# Patient Record
Sex: Male | Born: 1990 | Hispanic: No | Marital: Single
Health system: Southern US, Community
[De-identification: ages and names within clinical notes are randomized; demographics above are authoritative.]

---
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
## 2012-12-28 ENCOUNTER — Observation Stay: Payer: Self-pay | Admitting: Internal Medicine

## 2012-12-28 LAB — URINALYSIS, COMPLETE
Bacteria: NONE SEEN
Bilirubin,UR: NEGATIVE
Glucose,UR: NEGATIVE mg/dL (ref 0–75)
Leukocyte Esterase: NEGATIVE
Ph: 6 (ref 4.5–8.0)
Specific Gravity: 1.029 (ref 1.003–1.030)
Squamous Epithelial: NONE SEEN
WBC UR: 1 /HPF (ref 0–5)

## 2012-12-28 LAB — SALICYLATE LEVEL: Salicylates, Serum: <1.7 mg/dL

## 2012-12-28 LAB — DRUG SCREEN, URINE
Barbiturates, Ur Screen: NEGATIVE (ref ?–200)
Benzodiazepine, Ur Scrn: NEGATIVE (ref ?–200)
Cannabinoid 50 Ng, Ur ~~LOC~~: POSITIVE (ref ?–50)
MDMA (Ecstasy)Ur Screen: NEGATIVE (ref ?–500)
Opiate, Ur Screen: NEGATIVE (ref ?–300)

## 2012-12-28 LAB — CBC
MCHC: 34.6 g/dL (ref 32.0–36.0)
Platelet: 260 10*3/uL (ref 150–440)
WBC: 19 10*3/uL — ABNORMAL HIGH (ref 3.8–10.6)

## 2012-12-28 LAB — CK TOTAL AND CKMB (NOT AT ARMC)
CK, Total: 193 U/L (ref 35–232)
CK-MB: 2.7 ng/mL (ref 0.5–3.6)
CK-MB: 2 ng/mL (ref 0.5–3.6)

## 2012-12-28 LAB — TROPONIN I
Troponin-I: <0.02 ng/mL
Troponin-I: <0.02 ng/mL

## 2012-12-28 LAB — COMPREHENSIVE METABOLIC PANEL
Albumin: 4.5 g/dL (ref 3.8–5.6)
Alkaline Phosphatase: 104 U/L (ref 98–317)
Anion Gap: 7 (ref 7–16)
BUN: 16 mg/dL (ref 7–18)
Bilirubin,Total: 0.8 mg/dL (ref 0.2–1.0)
Chloride: 102 mmol/L (ref 98–107)
SGOT(AST): 27 U/L (ref 10–41)
SGPT (ALT): 27 U/L (ref 12–78)

## 2012-12-28 LAB — ACETAMINOPHEN LEVEL: Acetaminophen: <2 ug/mL

## 2012-12-29 LAB — CBC WITH DIFFERENTIAL/PLATELET
Basophil %: 0.2 %
Eosinophil #: 0.2 10*3/uL (ref 0.0–0.7)
Eosinophil %: 2.7 %
HCT: 41.5 % (ref 40.0–52.0)
Lymphocyte %: 35.8 %
MCH: 31 pg (ref 26.0–34.0)
MCHC: 33.6 g/dL (ref 32.0–36.0)
MCV: 92 fL (ref 80–100)
Monocyte #: 0.5 x10 3/mm (ref 0.2–1.0)
Neutrophil #: 4.6 10*3/uL (ref 1.4–6.5)
RBC: 4.51 10*6/uL (ref 4.40–5.90)
RDW: 13.1 % (ref 11.5–14.5)
WBC: 8.5 10*3/uL (ref 3.8–10.6)

## 2012-12-29 LAB — CK TOTAL AND CKMB (NOT AT ARMC)
CK, Total: 101 U/L (ref 35–232)
CK-MB: 1.5 ng/mL (ref 0.5–3.6)

## 2013-12-16 IMAGING — CR DG CHEST 2V
1 series · 2 of 2 positions shown · non-contrast
Comparison: none

REASON FOR EXAM: chest pain
COMMENTS:

[Series 1: pa · 0.17mm/px · 2 of 2 slices shown]
[im 1/2]
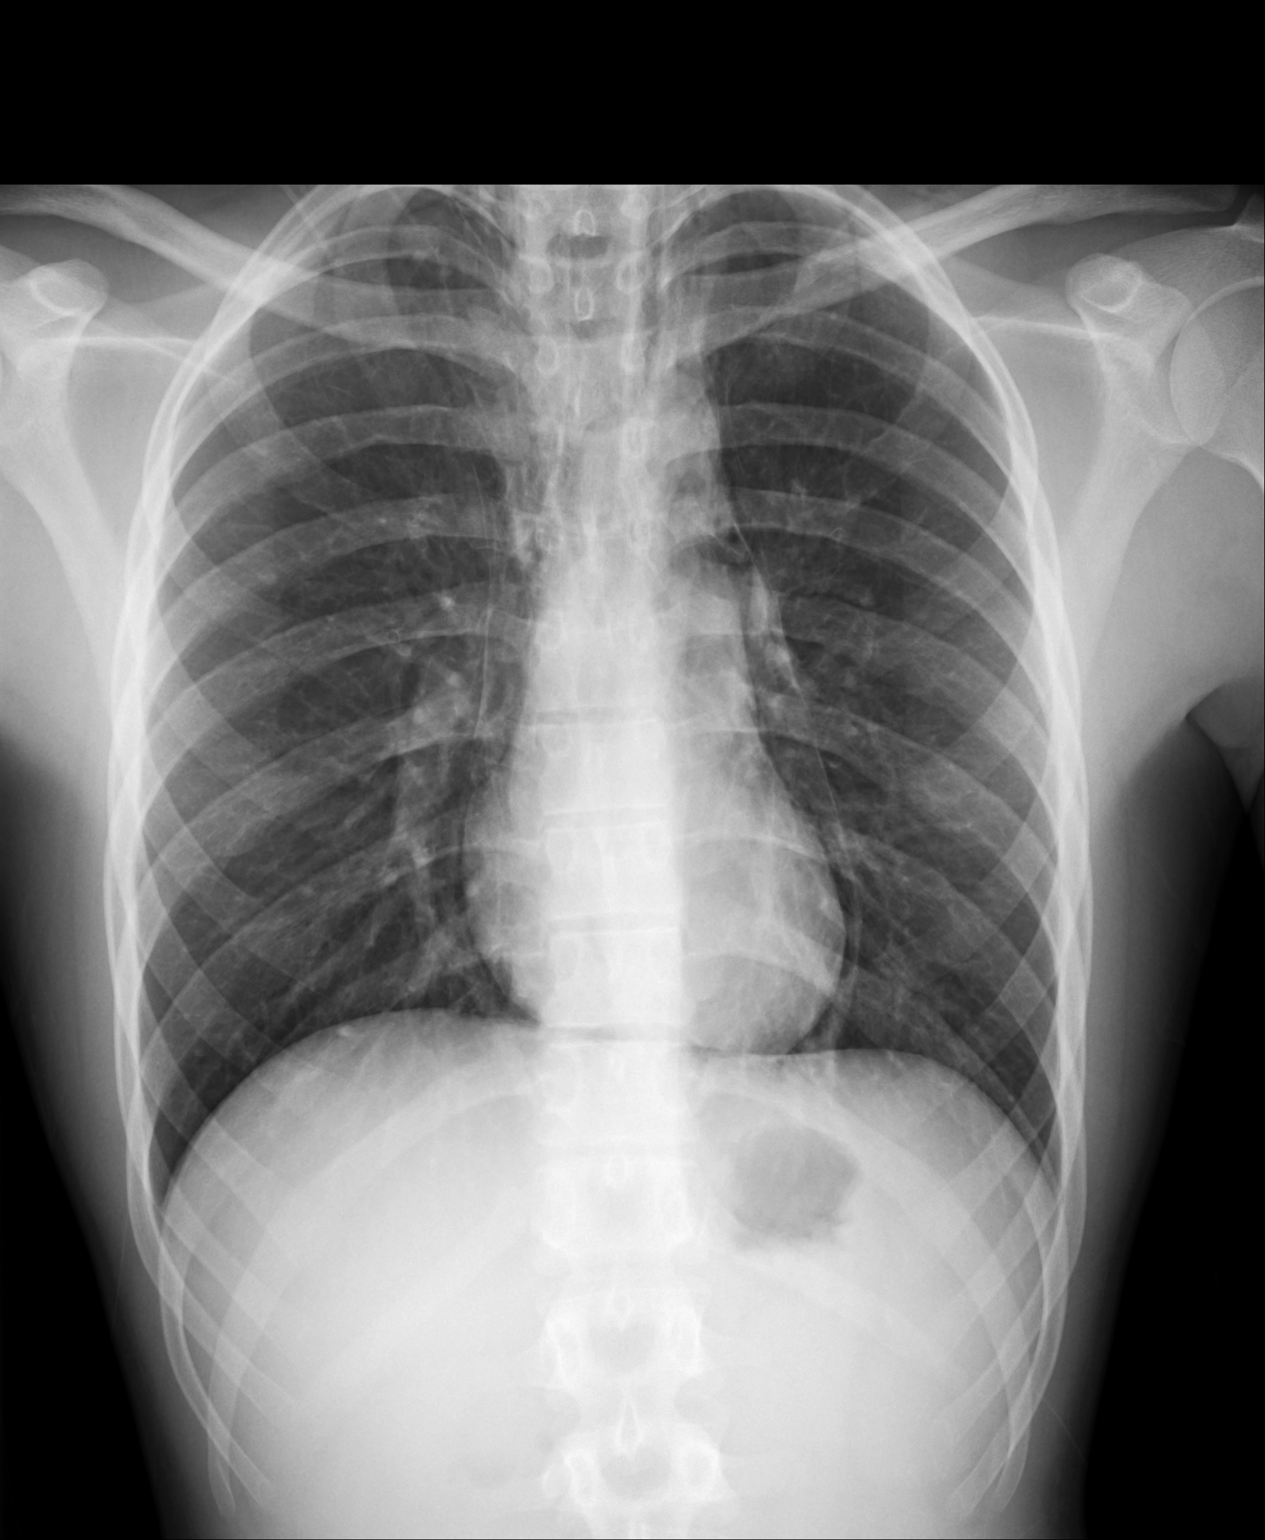
[im 2/2]
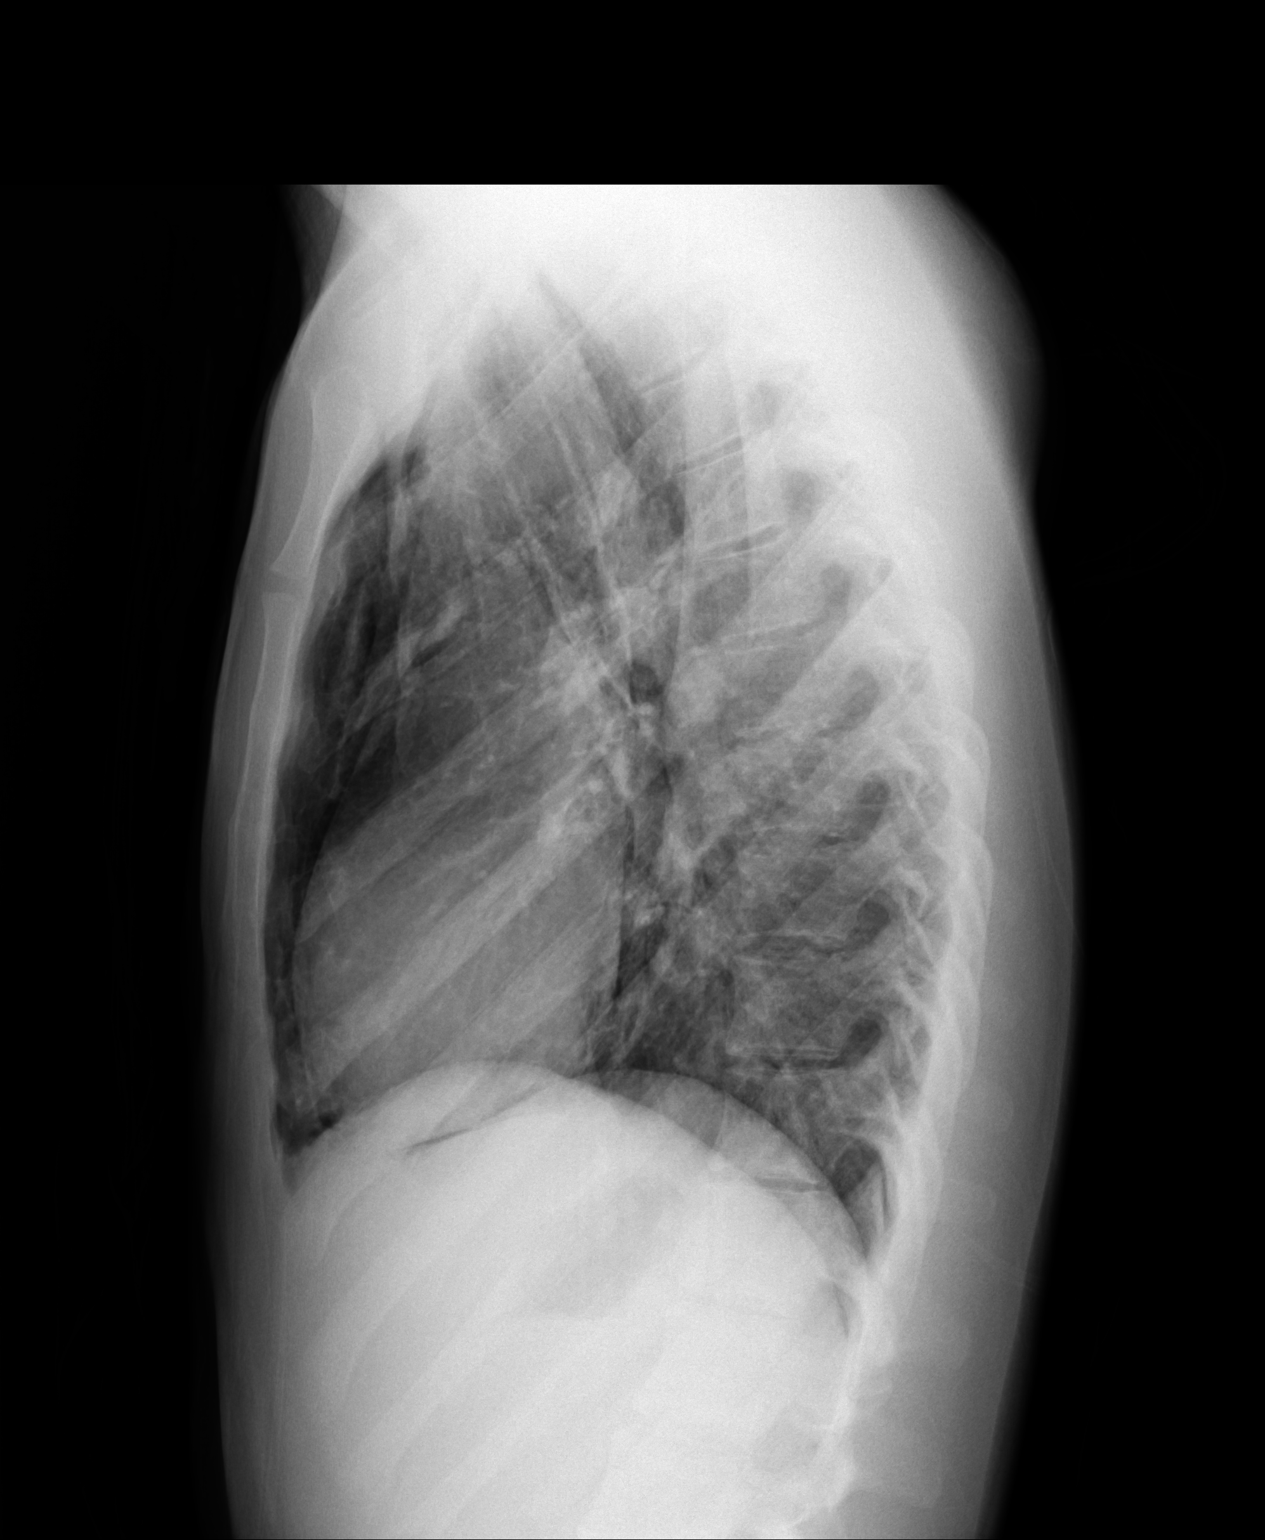

[2 of 2 positions shown; findings below may reference images not displayed]

PROCEDURE:     DXR - DXR CHEST PA (OR AP) AND LATERAL  - December 28, 2012  [DATE]

RESULT:     The lungs are mildly hyperinflated. There is pneumomediastinum
bilaterally. I do not see evidence of a pneumothorax. There is a small
amount of subcutaneous emphysema at the left neck base. There is subtle
nodularity in the right pulmonary apex which may be a confluence of
densities. The cardiac silhouette is normal in size. The pulmonary
vascularity is not engorged. There is no pleural effusion.
IMPRESSION: There is a pneumomediastinum bilaterally. There is no shift
of normal structures. There is no definite evidence of a pneumothorax.
Subtle nodularity in the right pulmonary apex merits further evaluation when
the patient can tolerate the procedure.

[REDACTED]

Findings were discussed by me by telephone with Dr. Eraslan [DATE] on
December 28, 2012.

## 2013-12-17 IMAGING — CR DG CHEST 2V
1 series · 2 of 2 positions shown · non-contrast
Comparison: none

REASON FOR EXAM: Pneumomediastinum
COMMENTS:

PROCEDURE:     DXR - DXR CHEST PA (OR AP) AND LATERAL  - December 29, 2012  [DATE]
RESULT:     Comparison: 12/28/2012

[Series 1: pa · 0.17mm/px · 2 of 2 slices shown]
[im 1/2]
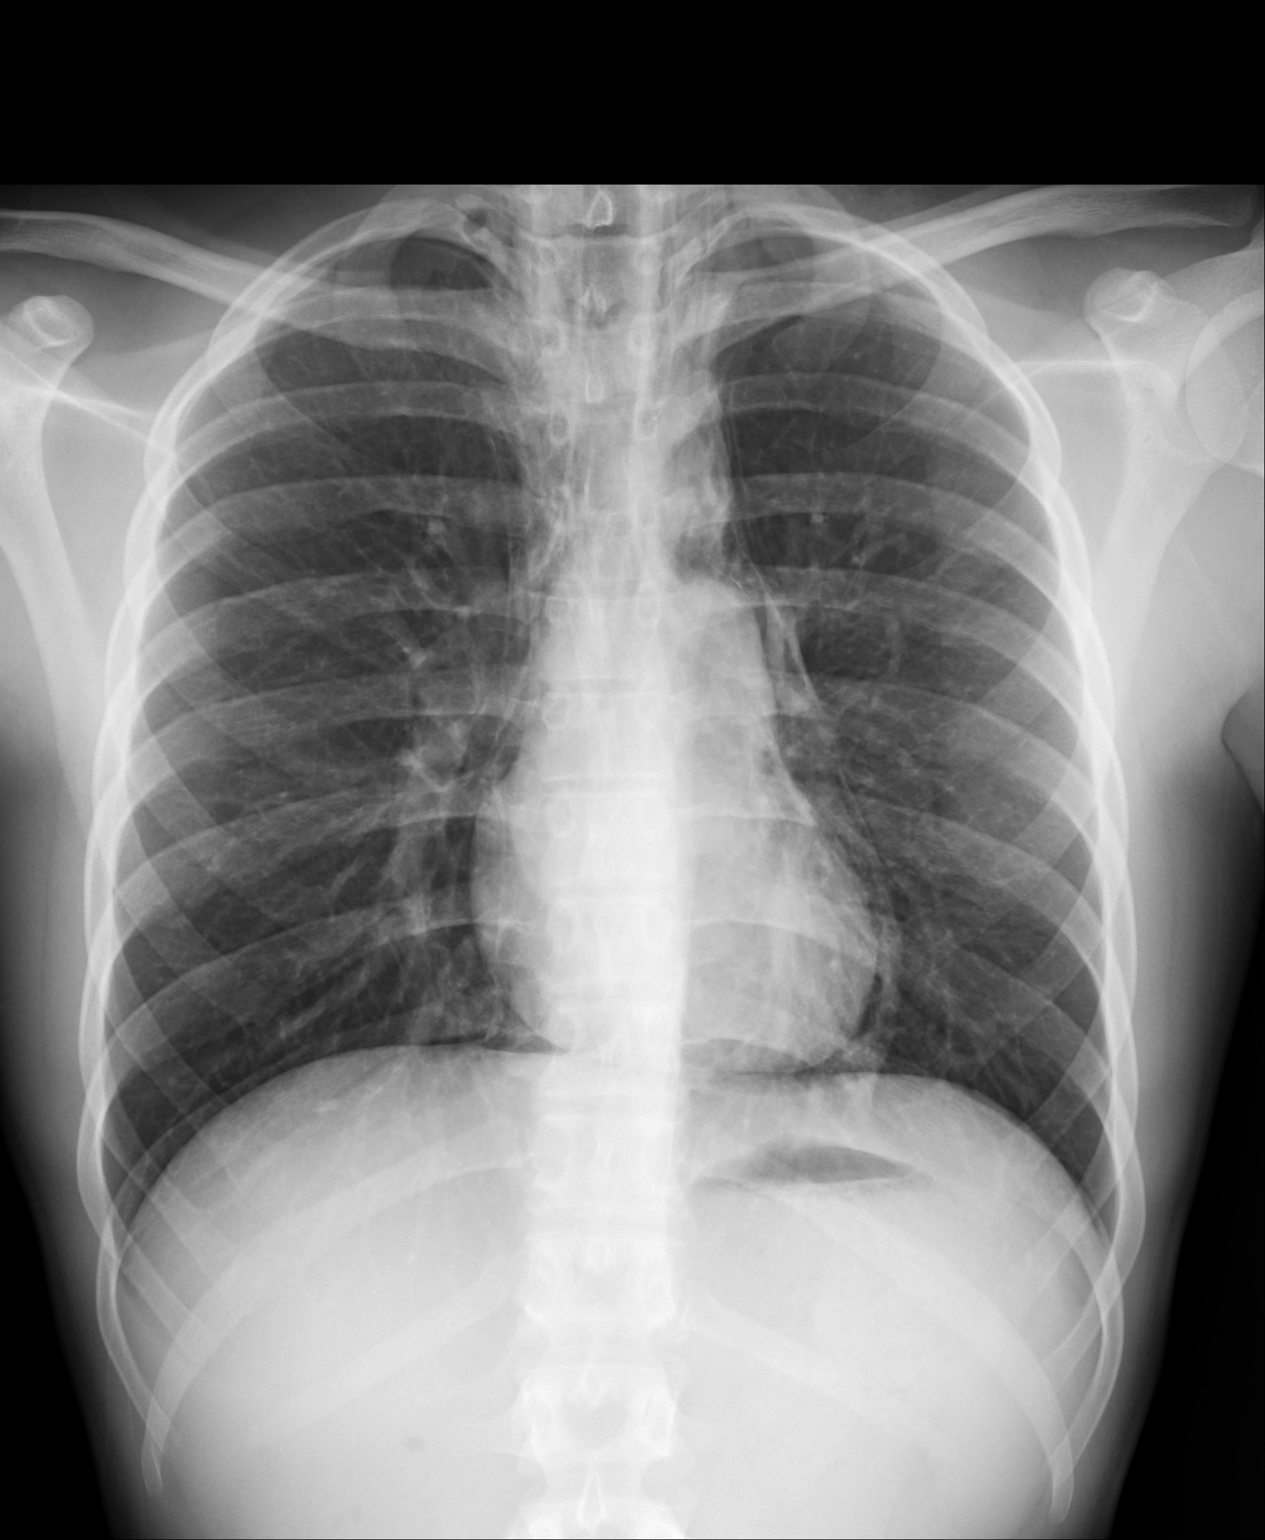
[im 2/2]
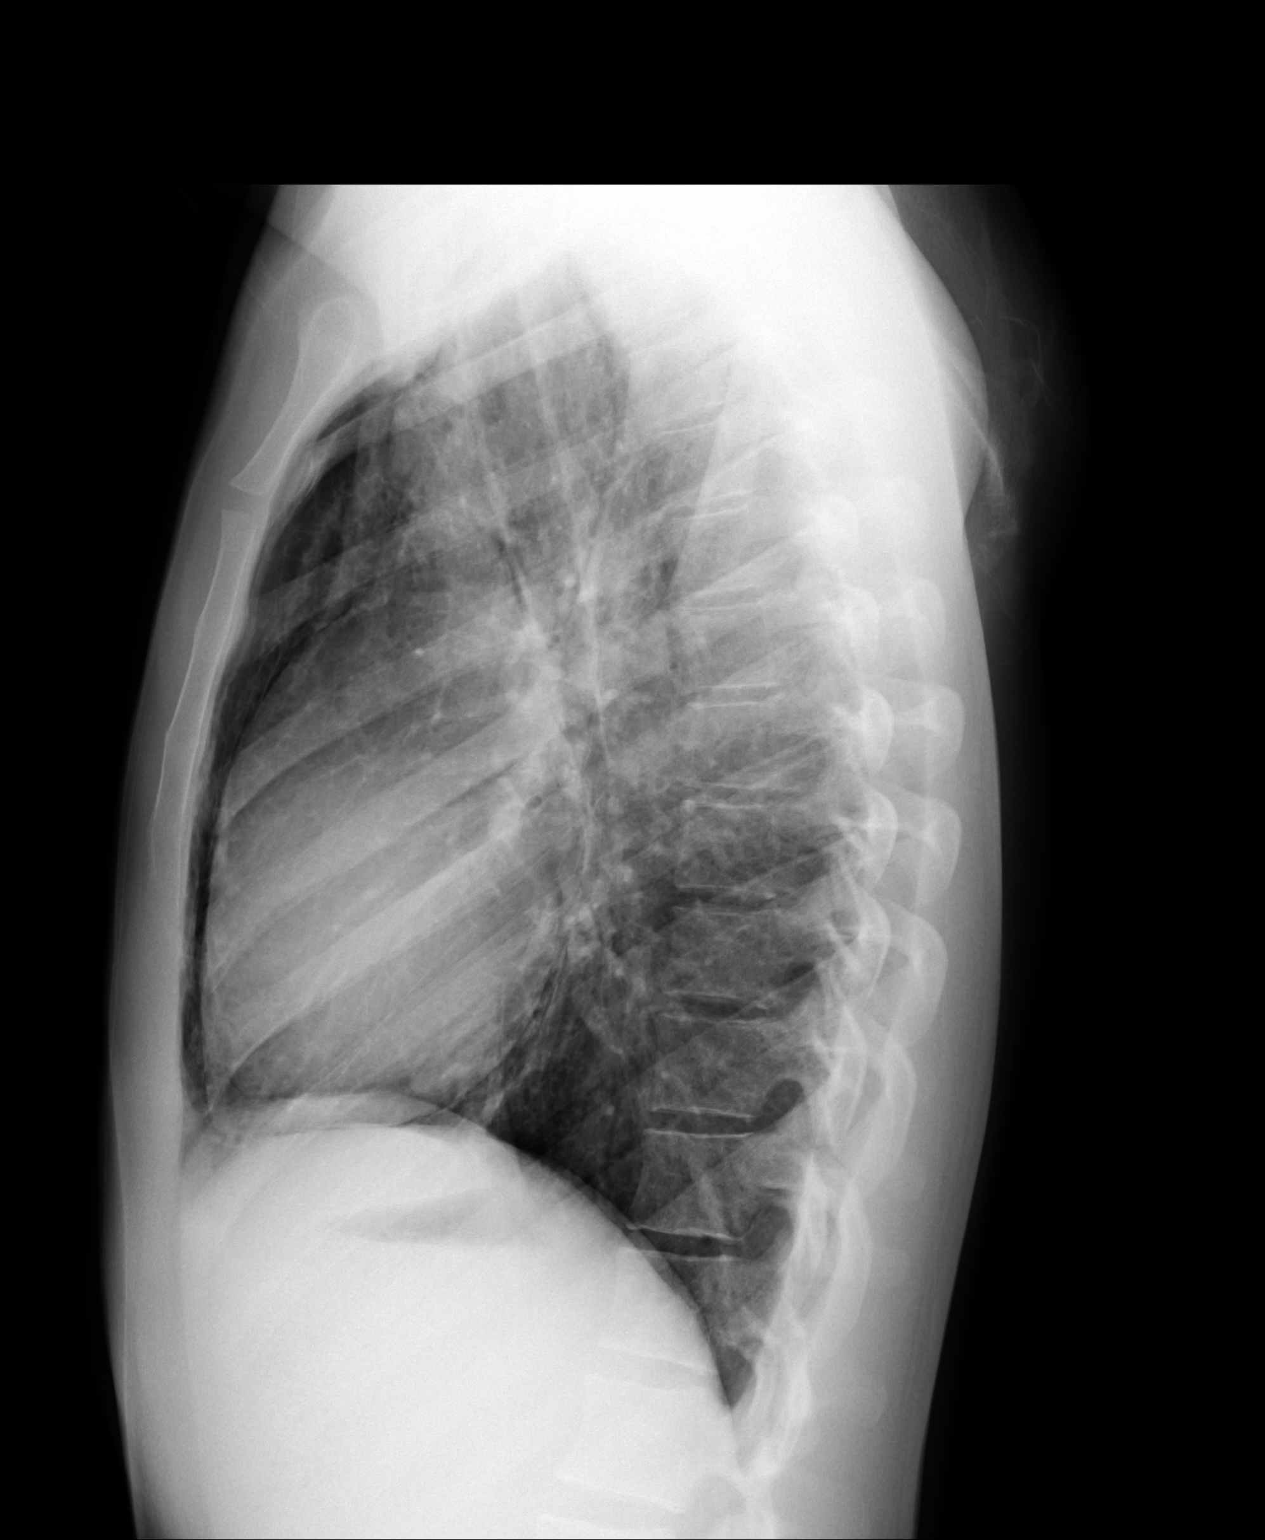

[2 of 2 positions shown; findings below may reference images not displayed]

FINDINGS: PA and lateral chest radiographs are provided.  There is no focal
parenchymal opacity, pleural effusion, or pneumothorax. There is
pneumomediastinum. The heart and mediastinum are unremarkable.  The osseous
structures are unremarkable.
IMPRESSION: Pneumomediastinum.

[REDACTED]

## 2015-04-08 NOTE — Consult Note (Signed)
PATIENT NAME:  Stephen Oneill, Stephen Oneill MR#:  [id number] DATE OF BIRTH:  October 26, 1991  DATE OF CONSULTATION:  12/28/2012  REFERRING PHYSICIAN:   CONSULTING PHYSICIAN:  Karmon Andis K. Lamari Youngers, MD  SUBJECTIVE: The patient was asked to be seen in consultation, room 221.  The patient is a 24 year old white male who is employed at [employer] and has held the job for less than a year. The patient is married for less than a year and thinking about divorce as they have marital conflicts. The patient reports that he has been feeling depressed about this marriage and he smoked crystal meth which he bought. He did not know that crystal meth was laced with cocaine. He started having chest pain and was worried and came for help to [facility].   PAST PSYCHIATRIC HISTORY: The patient reports that he was diagnosed with oppositional defiant disorder (ODD) and also with depression. He was being followed by a psychiatrist in the past, but has not seen a psychiatrist in the past 2 years. Not been on any medications in the past 2 years.   ALCOHOL AND DRUGS: Denies drinking alcohol. Does admit smoking THC at the rate of one gram occasionally and not on an every day basis. Denies crack cocaine abuse, except that he did not know that crystal meth was laced with cocaine. Does admit having had problems with crystal meth and he tried to wean himself off and has no used it in quite some time, except last night he felt depressed and smoked it. Does admit smoking nicotine cigarettes at the rate of 1/2 pack a day for many years.   MENTAL STATUS EXAMINATION: The patient is seen lying comfortably in bed, alert and oriented to place, person and time and fully aware of the situation that brought him for admission to [facility]. Affect is bright and neutral and reports that he is not depressed. Denies feeling hopeless or helpless, denies feeling worthless or useless and denies suicidal ideas or plans. Admits that he got depressed when he was thinking about  his divorce and so he smoked crystal meth, but not anymore. No psychosis. General knowledge and information is fair for level of education. Cognition is intact. Denies any ideas or plans to hurt himself or others and contracts for safety.  IMPRESSION:  1. History of oppositional defiant disorder (ODD), marital conflict and problems with depressed related to the same. 2. 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 3. Nicotine dependence. 4. THC abuse-dependence.   RECOMMENDATION: Discharge the patient and request Social Services to refer him for outpatient treatment for his marital conflicts and substance abuse and past history of psychiatric illness and current problems with depression related to the same.  ____________________________ Jannet MantisSurya K. Guss Bundehalla, MD skc:sb D: 12/28/2012 19:34:54 ET T: 12/29/2012 08:01:55 ET JOB#: [id number]  cc: Monika SalkSurya K. Guss Bundehalla, MD, <Dictator> Beau FannySURYA K Mical Brun MD ELECTRONICALLY SIGNED 01/03/2013 18:03

## 2015-04-08 NOTE — Consult Note (Signed)
PATIENT NAME:  Stephen Oneill, Stephen Oneill DATE OF BIRTH:  1991/04/05  DATE OF CONSULTATION:  12/29/2012  REFERRING PHYSICIAN:   CONSULTING PHYSICIAN:  Bobbijo Holst E. Thelma Bargeaks, MD  REASON FOR CONSULTATION: Pneumomediastinum.   I have personally seen and examined Stephen Oneill. I have discussed his care with Dr. Marshia Lyandy Ely.   HISTORY OF PRESENT ILLNESS: Stephen Oneill is a 24 year old Caucasian gentleman who presented to the Emergency Department early Sunday morning. On Saturday evening, he smoked some methamphetamine and began experiencing some chest pain about an hour to an hour and a half later. He went to sleep and when he awoke his chest pain was more severe and he presented to the Emergency Department. A chest x-ray showed a pneumomediastinum and he then had a chest CT and a barium swallow. The chest CT revealed extensive mediastinal emphysema without evidence of pneumothorax or pleural effusion. A barium swallow did not reveal any evidence of a leak.   Since he has been admitted, he states his pain has been improved. He has had no fevers at home. He has had no prior history of chest pain or any heart or lung disorders. He has been a healthy individual without any medical problems.   PAST MEDICAL HISTORY: Significant only for depression. He does not take any medications for this. He has had no surgical interventions.   FAMILY HISTORY: Significant for depression in both his mother and father. There is no family history of any heart or lung disease or disorders.   SOCIAL HISTORY: The patient is married. He works at [employer] as well as [employer].  He lives out of his vehicle. He smokes half-pack a day. He drinks intermittently and does have a history of other recreational drug use.   REVIEW OF SYSTEMS: As per history of present illness and all other review of systems were asked and were negative.    PHYSICAL EXAMINATION: Examination revealed a thin, pleasant, well-developed  and well-nourished gentleman in no distress. He did have a rash over his right neck. Otherwise his neck was supple without thyromegaly or adenopathy. The pupils were equal. The oropharynx appeared clear. There was no palpable crepitus within the neck. His lungs were clear. His heart was regular without murmurs. There was a mediastinal crunch with cardiac motion. His abdomen was soft, nontender and nondistended. There were no palpable masses. There was no hepatosplenomegaly. His extremities were without clubbing, cyanosis or edema. He had good pulses throughout.   ASSESSMENT AND PLAN: I have independently reviewed the patient's CT and his barium swallow. I do not see any evidence of leak on that. The patient adamantly denies any vomiting or regurgitation over the last 48 hours. Therefore, I would imagine that his pneumothorax has actually come from ruptured blebs in the lung tracking along the major bronchi into the mediastinum. I would imagine this could occur with deep inhalation of methamphetamine smoke. At the present time, I did not see any indication for surgery. I would recommend we repeat his white blood cell count and allow him to have some clear liquids. His diet can be advanced as tolerated.   Thank you very much for allowing me to participate in his care. ____________________________ Sheppard Plumberimothy E. Thelma Bargeaks, MD teo:sb D: 12/29/2012 11:53:07 ET T: 12/29/2012 12:16:55 ET JOB#: [id number]  cc: Sheppard Plumberimothy E. Thelma Bargeaks, MD, <Dictator> Jasmine DecemberIMOTHY E Kynzee Devinney MD ELECTRONICALLY SIGNED 12/30/2012 12:25

## 2015-04-08 NOTE — Consult Note (Signed)
Brief Consult Note: Diagnosis: pneumomediastinum.   Patient was seen by consultant.   Consult note dictated.   Discussed with Attending MD.   Comments: Extensive mediastinal emphysema in the setting smoking methamphetamine.  Barium swallow is negative for leak.  Recommend liquid diet and advance as tolerated.  No indication for surgery.  Electronic Signatures: Jasmine Decemberaks, Estrellita Lasky E (MD)  (Signed 13-Jan-14 11:46)  Authored: Brief Consult Note   Last Updated: 13-Jan-14 11:46 by Jasmine Decemberaks, Brecken Walth E (MD)

## 2015-04-08 NOTE — Consult Note (Signed)
PATIENT NAME:  Stephen Oneill, Stephen Oneill MR#:  [id number] DATE OF BIRTH:  02/17/1991  DATE OF CONSULTATION:  12/28/2012  CONSULTING PHYSICIAN:  Quentin Ore III, MD  PRIMARY CARE PHYSICIAN: None.   ADMITTING PHYSICIAN: Prime Doc.  CHIEF COMPLAINT: Chest pain.   BRIEF HISTORY: Gee Habig is a 24 year old boy seen in the hospital having admitted to the Emergency Room by the medical hospitalist service. The patient was in his usual state of good health until last evening. He has had some emotional issues recently with some personal problems in his marriage and ended up using some methamphetamine and cocaine last evening. Drug use is not a common occurrence for him, but he has used recreational drugs in the past. He did not have any immediate symptoms, but began to develop some chest pain and some mild heaviness in the center of his chest. He did not feel nauseated, did not vomit, does not describe any retching, coughing or sneezing. He went to bed without any real improvement in his symptoms and slept normally and woke up this morning with continued chest pressure, increasing pain and felt worse than he had when he went to bed. Again, he did not vomit. He has not had any other GI symptoms. He presented to the Emergency Room for further evaluation. Plain films of the chest in the Emergency Room revealed some mediastinal air. He had an elevated white blood cell count of 19,000. CT scan of the chest was performed which revealed significant amount of mediastinal air without evidence of a clear-cut etiology. The patient was admitted to the medical service and the surgical service was consulted.   The patient does smoke cigarettes. Drinks alcohol occasionally.  He has no other major medical problems. Specifically, time he denies any cardiac disease, hypertension, diabetes, thyroid problems. Has no previous surgery. No history of hepatitis, yellow jaundice, pancreatitis, gallbladder disease, or diverticulitis.  He does have a history of H-pylori infection and possible peptic ulcer disease. Review of systems otherwise unremarkable.   PHYSICAL EXAMINATION:  GENERAL: He is an alert, pleasant, emotionally upset young man in no significant distress.  VITAL SIGNS: Blood pressure 125/52, heart rate 70, and regular .  Pain scale is 7.  HEENT: No scleral icterus, no ________ deformities and no pupillary abnormalities.   NECK: Supple without adenopathy. Trachea is midline with no crepitus.  CHEST: Clear with no crepitus. He does have very distant breath sounds. He has normal pulmonary excursion, although he does have some left-sided pleuritic chest pain.  CARDIAC: No murmurs or gallops to my ear. He seems to be in normal sinus rhythm.  ABDOMEN: Benign with no organomegaly and no rebound. No guarding. No masses. No tenderness.  EXTREMITIES: Lower extremity exam reveals full range of motion.  No deformities.  Good distal pulses.  PSYCHIATRIC: Exam reveals very anxious emotional young man with a normal orientation.   IMPRESSION: I have independently reviewed his CT scan and discussed the CT scan, both with Radiology and Thoracic Surgery. There is a significant amount of air in his mediastinum, extending all the way up into his cervical area. The esophagus does not appear to be distended or dilated.  There does not appear to be any evidence of fluid around the pleural spaces.   I do not have an etiology for this problem. I am very concerned about the possibility of a perforated or ruptured esophagus with the amount of air in the mediastinum despite the fact that he has no obvious retching, nausea or vomiting.  I have discussed the plan with Radiology and we are going to set him up for urgent direct imaging study. If the study is positive, he will need urgent repair.  If the study is negative then we will consider antibiotic therapy and observation. If his airway becomes compromised he may need intubation.   This plan  has been discussed with the patient in detail and he is in agreement.     ____________________________ Carmie Endalph L. Ely III, MD rle:eg D: 12/28/2012 14:49:01 ET T: 12/28/2012 21:47:56 ET JOB#: [id number]  cc: Quentin Orealph L. Ely III, MD, <Dictator> Quentin OreALPH L ELY MD ELECTRONICALLY SIGNED 01/02/2013 8:48

## 2015-04-08 NOTE — H&P (Signed)
PATIENT NAME:  Stephen Oneill, Stephen Oneill MR#:  [id number] DATE OF BIRTH:  Sep 08, 1991  DATE OF ADMISSION:  12/28/2012  PRIMARY CARE PHYSICIAN:  None.  CHIEF COMPLAINT: Chest pain.   HISTORY OF PRESENT ILLNESS:  The patient is a 24 year old Caucasian gentleman with no significant past medical history who is currently undergoing a divorce.  He was very upset yesterday and took some methamphetamine. The patient says he smoked it and came in today complaining of chest pain hurting really bad all over. He had some trouble breathing. His work-up in the Emergency Room showed chest x-ray which is positive for pneumomediastinum bilaterally. There is no shift of normal structures. The patient denies any retching, vomiting or any abdominal pain.  His sats are 96% on room air. His white count is elevated to 19,000. He is being admitted for further evaluation and management.   PAST MEDICAL HISTORY:  Depression.  The patient was on Cymbalta about 2 years ago, however, has been off medicines since 2 years ago.   FAMILY HISTORY: Significant for depression in both mother and father.   SOCIAL HISTORY:  He does drugs on and off. The patient states does marijuana and did methamphetamine yesterday. He is not aware of using cocaine which was positive in his urine drug screen. He also uses alcohol; however, he is not opening up to quantify as to what he drinks and how much he drinks. He smokes about 1/2 pack a day. Denies any DUIs.  The patient works at [employer] and with [employer].  REVIEW OF SYSTEMS:  CONSTITUTIONAL: No fever, fatigue, weakness.  EYES: No blurred or double vision or redness.  ENT: No tinnitus, ear pain, hearing loss.  RESPIRATORY: Positive for chest pain and shortness of breath.  CARDIOVASCULAR: Positive for chest pain. No hypertension or dyspnea on exertion.  GASTROINTESTINAL: No nausea, vomiting, diarrhea, abdominal pain.  No GERD GENITOURINARY: No dysuria or hematuria.   ENDOCRINE: No polyuria,  nocturia or thyroid problems.  HEMATOLOGY: No anemia or easy bruising.  SKIN: No acne or rash.  MUSCULOSKELETAL: No arthritis.  NEUROLOGIC: No CVA or TIA. PSYCHIATRIC: Positive for depression.   All other systems reviewed and negative.   PHYSICAL EXAMINATION: GENERAL: The patient is awake, alert, oriented x 3, not in acute distress.  VITAL SIGNS: Afebrile, pulse 72, blood pressure is 116/61, sats are 96% to 98% on room air. HEENT: Atraumatic, normocephalic. Pupils are equal, round, and reactive to light and accommodation. Extraocular movements intact. Oral mucosa is moist.  NECK: Supple. No JVD. No carotid bruit.  LUNGS: Clear to auscultation bilaterally. No rales, rhonchi, respiratory distress or labored breathing.  HEART: Both the heart sounds are normal. Rate, rhythm regular. PMI not lateralized. Chest is nontender.  EXTREMITIES: Good pedal pulses, good femoral pulses. No good femoral pulses. No lower extremity edema.  ABDOMEN: Soft, benign and nontender. No organomegaly. Positive bowel sounds.  NEUROLOGIC: Grossly intact cranial nerves II through XII. No motor or sensory deficits.  PSYCHIATRIC: The patient is awake, alert, oriented x 3. He appears to be depressed and he is labile with crying spells.   LABORATORY AND DIAGNOSTIC DATA:  His EKG shows sinus rhythm with marked sinus arrhythmia. Chest x-ray shows bilateral pneumomediastinum. No shift of normal structures, no evidence of pneumothorax, subtle nodularity in the right pulmonary apex merits further evaluation when the patient can tolerate the procedure. Troponin, first set, negative. CBC within normal limits except white count of 19,000. Comprehensive metabolic panel within normal limits. UA negative for UTI. Urine  drug screen positive for cocaine and cannabinoids. Acetaminophen, serum salicylate and ethanol levels are within normal limits. TSH is 1.72.   ASSESSMENT:  A84 year old male with history of depression and polysubstance  abuse who comes in with:   1.  Chest pain. The patient reports using methamphetamine, cannabinoids yesterday.  He smoked and thereafter started feeling hurting all over including his chest and did have some shortness of breath. Denies vomiting. He was found to have bilateral pneumomediastinum.  Source unknown at this time. The patient is hemodynamically stable. We will admit the patient to surgical floor at this time and monitor closely. Oxygen as needed. The patient currently is 96% to 77% on room air. We will give p.r.n. morphine for chest pain intravenously.  Empirically continue Levaquin. His white count is elevated to 19,000. The case was discussed with Dr. Michela Pitcher who will see the patient in consultation. We will do a CT of the chest with contrast to see if there is any other source to look for source for the air leak.  We will also repeat a chest x-ray in the morning to make sure the pneumomediastinum does not progress.  2.  Leukocytosis. We will continue empirically p.o. Levaquin.  3.  Polysubstance drug abuse with urine drug from positive for cannabinoids and cocaine.  The  patient is advised again abstinence.  4.  Ongoing depression with patient under recent stressor with divorce with his current wife. We will have psychiatry see the patient.  The patient denies any suicidal ideation at this time; however, we will have psychiatry evaluation. He was on Cymbalta about 2 years ago.  5.  Deep vein thrombosis prophylaxis. Will give heparin subcutaneous.  6.  We will have further work-up according to the patient's clinical course.   The hospital admission plan was discussed with the patient. No family members are present in the Emergency Room.   TIME SPENT: 50 minutes.   ____________________________ Wylie Hail Allena Katz, MD sap:ct D: 12/28/2012 11:51:42 ET T: 12/28/2012 14:36:49 ET JOB#: [id number]  cc: Tarrah Furuta A. Allena Katz, MD, <Dictator> Willow Ora MD ELECTRONICALLY SIGNED 12/30/2012 7:27
